# Patient Record
Sex: Female | Born: 1975 | Hispanic: Yes | Marital: Married | Smoking: Never smoker
Health system: Southern US, Community
[De-identification: ages and names within clinical notes are randomized; demographics above are authoritative.]

## PROBLEM LIST (undated history)

## (undated) ENCOUNTER — Ambulatory Visit (HOSPITAL_COMMUNITY): Admission: EM | Source: Home / Self Care

## (undated) DIAGNOSIS — E119 Type 2 diabetes mellitus without complications: Secondary | ICD-10-CM

## (undated) HISTORY — PX: TUBAL LIGATION: SHX77

## (undated) HISTORY — DX: Type 2 diabetes mellitus without complications: E11.9

## (undated) HISTORY — PX: FOOT SURGERY: SHX648

---

## 2008-11-09 DIAGNOSIS — E119 Type 2 diabetes mellitus without complications: Secondary | ICD-10-CM | POA: Insufficient documentation

## 2017-07-25 ENCOUNTER — Emergency Department (HOSPITAL_COMMUNITY)
Admission: EM | Admit: 2017-07-25 | Discharge: 2017-07-25 | Disposition: A | Discharge status: Home / Self Care | Payer: 59 | Attending: Emergency Medicine | Admitting: Emergency Medicine

## 2017-07-25 ENCOUNTER — Encounter (HOSPITAL_COMMUNITY): Payer: Self-pay | Admitting: Emergency Medicine

## 2017-07-25 DIAGNOSIS — G43901 Migraine, unspecified, not intractable, with status migrainosus: Secondary | ICD-10-CM | POA: Diagnosis not present

## 2017-07-25 DIAGNOSIS — Z7984 Long term (current) use of oral hypoglycemic drugs: Secondary | ICD-10-CM | POA: Insufficient documentation

## 2017-07-25 DIAGNOSIS — G43001 Migraine without aura, not intractable, with status migrainosus: Secondary | ICD-10-CM | POA: Diagnosis not present

## 2017-07-25 DIAGNOSIS — R51 Headache: Secondary | ICD-10-CM | POA: Diagnosis present

## 2017-07-25 LAB — POC URINE PREG, ED: Preg Test, Ur: NEGATIVE

## 2017-07-25 MED ORDER — PROCHLORPERAZINE EDISYLATE 5 MG/ML IJ SOLN
10.0000 mg | Freq: Once | INTRAMUSCULAR | Status: AC
  Administered 2017-07-25: 10 mg via INTRAVENOUS
  Filled 2017-07-25: qty 2

## 2017-07-25 MED ORDER — DIPHENHYDRAMINE HCL 50 MG/ML IJ SOLN
25.0000 mg | Freq: Once | INTRAMUSCULAR | Status: AC
  Administered 2017-07-25: 25 mg via INTRAVENOUS
  Filled 2017-07-25: qty 1

## 2017-07-25 MED ORDER — SODIUM CHLORIDE 0.9 % IV BOLUS (SEPSIS)
1000.0000 mL | Freq: Once | INTRAVENOUS | Status: AC
  Administered 2017-07-25: 1000 mL via INTRAVENOUS

## 2017-07-25 MED ORDER — ONDANSETRON 4 MG PO TBDP
4.0000 mg | ORAL_TABLET | Freq: Once | ORAL | Status: AC
  Administered 2017-07-25: 4 mg via ORAL

## 2017-07-25 MED ORDER — ONDANSETRON 4 MG PO TBDP
ORAL_TABLET | ORAL | Status: AC
  Filled 2017-07-25: qty 1

## 2017-07-25 MED ORDER — KETOROLAC TROMETHAMINE 30 MG/ML IJ SOLN
30.0000 mg | Freq: Once | INTRAMUSCULAR | Status: AC
  Administered 2017-07-25: 30 mg via INTRAVENOUS
  Filled 2017-07-25: qty 1

## 2017-07-25 MED ORDER — MAGNESIUM SULFATE 2 GM/50ML IV SOLN
2.0000 g | Freq: Once | INTRAVENOUS | Status: AC
  Administered 2017-07-25: 2 g via INTRAVENOUS
  Filled 2017-07-25: qty 50

## 2017-07-25 NOTE — ED Provider Notes (Signed)
[facility] DEPT Provider Note   CSN: [id number] Arrival date & time: 07/25/17  1120     History   Chief Complaint Chief Complaint  Patient presents with  . Migraine  . Emesis    HPI Priscilla Perry is a 41 y.o. female.  44-year-old female with past medical history including migraines who presents with headache and vomiting. Yesterday the patient woke up from a nap and had a throbbing headache that she states is exactly like previous migraines. She tried taking ibuprofen without much relief. This morning she woke up and felt like it was slightly better so she took an oral dissolving pill that is a migraine medication. Despite this medication her symptoms have worsened. She has had nausea and vomiting which she sometimes gets with her migraines. She reports photophobia and phonophobia. No extremity numbness or weakness. No problems walking. She denies any fevers or recent illness. No recent head injury. Normal vision.   The history is provided by the patient.  Migraine   Emesis      History reviewed. No pertinent past medical history.  There are no active problems to display for this patient.   History reviewed. No pertinent surgical history.  OB History    No data available       Home Medications    Prior to Admission medications   Medication Sig Start Date End Date Taking? Authorizing Provider  ibuprofen (ADVIL,MOTRIN) 800 MG tablet Take 800 mg by mouth every 8 (eight) hours as needed for headache or moderate pain.   Yes [provider]  metFORMIN (GLUCOPHAGE) 1000 MG tablet Take 1,000 mg by mouth 2 (two) times daily with a meal.   Yes [provider]    Family History No family history on file.  Social History Social History  Substance Use Topics  . Smoking status: Never Smoker  . Smokeless tobacco: Never Used  . Alcohol use No     Allergies   Patient has no known allergies.   Review of Systems Review of Systems    Gastrointestinal: Positive for vomiting.   All other systems reviewed and are negative except that which was mentioned in HPI   Physical Exam Updated Vital Signs BP 125/89 (BP Location: Left Arm)   Pulse (!) 115   Temp 98.2 F (36.8 C) (Oral)   Resp 18   Ht  (1.702 m)   Wt 99.8 kg (220 lb)   LMP 07/21/2017   SpO2 100%   BMI 34.46 kg/m   Physical Exam  Constitutional: She is oriented to person, place, and time. She appears well-developed and well-nourished. No distress.  Uncomfortable, in dark room, Awake, alert  HENT:  Head: Normocephalic and atraumatic.  Eyes: Pupils are equal, round, and reactive to light. Conjunctivae and EOM are normal.  Neck: Neck supple.  Cardiovascular: Normal rate, regular rhythm and normal heart sounds.   No murmur heard. Pulmonary/Chest: Effort normal and breath sounds normal. No respiratory distress.  Abdominal: Soft. Bowel sounds are normal. She exhibits no distension. There is no tenderness.  Musculoskeletal: She exhibits no edema.  Neurological: She is alert and oriented to person, place, and time. She has normal reflexes. No cranial nerve deficit. She exhibits normal muscle tone.  Fluent speech, normal finger-to-nose testing, negative pronator drift, no clonus 5/5 strength and normal sensation x all 4 extremities  Skin: Skin is warm and dry.  Psychiatric: She has a normal mood and affect. Judgment and thought content normal.  Nursing note and vitals reviewed.  ED Treatments / Results  Labs (all labs ordered are listed, but only abnormal results are displayed) Labs Reviewed  POC URINE PREG, ED    EKG  EKG Interpretation None       Radiology No results found.  Procedures Procedures (including critical care time)  Medications Ordered in ED Medications  ondansetron (ZOFRAN-ODT) 4 MG disintegrating tablet (not administered)  sodium chloride 0.9 % bolus 1,000 mL (not administered)  magnesium sulfate IVPB 2 g 50 mL (2 g  Intravenous New Bag/Given 07/25/17 1507)  ondansetron (ZOFRAN-ODT) disintegrating tablet 4 mg (4 mg Oral Given 07/25/17 1145)  diphenhydrAMINE (BENADRYL) injection 25 mg (25 mg Intravenous Given 07/25/17 1502)  prochlorperazine (COMPAZINE) injection 10 mg (10 mg Intravenous Given 07/25/17 1506)  ketorolac (TORADOL) 30 MG/ML injection 30 mg (30 mg Intravenous Given 07/25/17 1501)     Initial Impression / Assessment and Plan / ED Course  I have reviewed the triage vital signs and the nursing notes.  Pertinent labs  that were available during my care of the patient were reviewed by me and considered in my medical decision making (see chart for details).     PT w/ headache and N/V similar to previous episodes of migraine. Reassuring VS and normal neurologic exam. Given hx similar to previous headaches and no concerning sx or findings on exam, I do not feel she needs head imaging. Gave migraine cocktail.   On reexamination, pt stated her headache was much improved.  Discussed return precautions including severe worsening symptoms, fever, neck stiffness, or any new neurologic symptoms. Patient voiced understanding and was discharged in satisfactory condition.  Final Clinical Impressions(s) / ED Diagnoses   Final diagnoses:  Migraine with status migrainosus, not intractable, unspecified migraine type    New Prescriptions New Prescriptions   No medications on file     Little, Ambrose Finland, MD 07/25/17 1540

## 2017-07-25 NOTE — ED Triage Notes (Signed)
Pt. Stated, I started having a headache and then nausea vomiting and I've taken everything and it keeps coming back up.

## 2017-08-17 DIAGNOSIS — W274XXA Contact with kitchen utensil, initial encounter: Secondary | ICD-10-CM | POA: Diagnosis not present

## 2017-08-17 DIAGNOSIS — S61011A Laceration without foreign body of right thumb without damage to nail, initial encounter: Secondary | ICD-10-CM | POA: Diagnosis not present

## 2017-12-22 DIAGNOSIS — N920 Excessive and frequent menstruation with regular cycle: Secondary | ICD-10-CM | POA: Insufficient documentation

## 2018-07-18 DIAGNOSIS — H608X3 Other otitis externa, bilateral: Secondary | ICD-10-CM | POA: Insufficient documentation

## 2018-07-18 DIAGNOSIS — H60549 Acute eczematoid otitis externa, unspecified ear: Secondary | ICD-10-CM | POA: Insufficient documentation

## 2020-01-22 ENCOUNTER — Other Ambulatory Visit: Payer: Self-pay | Admitting: Endocrinology

## 2020-01-23 ENCOUNTER — Other Ambulatory Visit: Payer: Self-pay | Admitting: Endocrinology

## 2020-01-23 DIAGNOSIS — R1013 Epigastric pain: Secondary | ICD-10-CM

## 2021-11-09 DIAGNOSIS — J189 Pneumonia, unspecified organism: Secondary | ICD-10-CM

## 2021-11-09 HISTORY — DX: Pneumonia, unspecified organism: J18.9

## 2022-02-12 ENCOUNTER — Ambulatory Visit (INDEPENDENT_AMBULATORY_CARE_PROVIDER_SITE_OTHER): Payer: 59 | Admitting: Pulmonary Disease

## 2022-02-12 ENCOUNTER — Encounter: Payer: Self-pay | Admitting: Pulmonary Disease

## 2022-02-12 VITALS — BP 120/74 | HR 97 | Temp 98.1°F | Ht 67.0 in | Wt 196.8 lb

## 2022-02-12 DIAGNOSIS — R9389 Abnormal findings on diagnostic imaging of other specified body structures: Secondary | ICD-10-CM

## 2022-02-12 DIAGNOSIS — J984 Other disorders of lung: Secondary | ICD-10-CM | POA: Diagnosis not present

## 2022-02-12 NOTE — Patient Instructions (Addendum)
Thank you for visiting Dr. Tonia Brooms at [facility]. ?Today we recommend the following: ? ?Orders Placed This Encounter  ?Procedures  ? CT CHEST HIGH RESOLUTION  ? ?See [place] after your CT Chest.  ? ?Return in about 2 weeks (around 02/26/2022) for with APP. To review CT Chest.  ? ? ? ?Please do your part to reduce the spread of COVID-19.  ? ?

## 2022-02-12 NOTE — Progress Notes (Signed)
? ?Synopsis: Referred in April 2023 for abnormal cxr by Adrian Prince, MD ? ?Subjective:  ? ?PATIENT ID: Priscilla Perry GENDER: female DOB: [birth year], MRN: [id number] ? ?Chief Complaint  ?Patient presents with  ? Consult  ?  Consult.   ? ? ?This is a 46 year old female, past medical history of diabetes, BMI of 30.  She presents today with an abnormal chest x-ray.  She was seen by primary care after having some pain along the left chest that is underneath the left breast.  She had a chest x-ray that showed an abnormality within the left lower lobe and she was treated with antibiotics.  She had a follow-up evaluation by primary care with Dr. Evlyn Kanner and had a repeat chest x-ray that showed the abnormality was still present.  Felt to be some scarring in the base of the lungs.  She has no significant family history for any rheumatologic diseases.  She has no significant family history of lung diseases.  She does have significant secondhand smoke exposure.  Both of her parents were smokers.  From respiratory standpoint she is able to do all of her activities of daily living and she has no  short notes of breath.  She does occasionally have cough. ? ? ?Past Medical History:  ?Diagnosis Date  ? Diabetes (HCC)   ?  ? ?Family History  ?Problem Relation Age of Onset  ? Gastric cancer Mother   ? Diabetes Mother   ? Diabetes Father   ?  ? ?Past Surgical History:  ?Procedure Laterality Date  ? CESAREAN SECTION    ? FOOT SURGERY    ? TUBAL LIGATION    ? ? ?Social History  ? ?Socioeconomic History  ? Marital status: Unknown  ?  Spouse name: Not on file  ? Number of children: Not on file  ? Years of education: Not on file  ? Highest education level: Not on file  ?Occupational History  ? Not on file  ?Tobacco Use  ? Smoking status: Never  ? Smokeless tobacco: Never  ?Substance and Sexual Activity  ? Alcohol use: No  ? Drug use: No  ? Sexual activity: Not on file  ?Other Topics Concern  ? Not on file  ?Social History Narrative  ? Not  on file  ? ?Social Determinants of Health  ? ?Financial Resource Strain: Not on file  ?Food Insecurity: Not on file  ?Transportation Needs: Not on file  ?Physical Activity: Not on file  ?Stress: Not on file  ?Social Connections: Not on file  ?Intimate Partner Violence: Not on file  ?  ? ?No Known Allergies  ? ?Outpatient Medications Prior to Visit  ?Medication Sig Dispense Refill  ? tirzepatide (MOUNJARO) 7.5 MG/0.5ML Pen INJECT 7.5 MG UNDER THE SKIN ONCE WEEKLY    ? ibuprofen (ADVIL,MOTRIN) 800 MG tablet Take 800 mg by mouth every 8 (eight) hours as needed for headache or moderate pain. (Patient not taking: Reported on 02/12/2022)    ? metFORMIN (GLUCOPHAGE) 1000 MG tablet Take 1,000 mg by mouth 2 (two) times daily with a meal. (Patient not taking: Reported on 02/12/2022)    ? ?No facility-administered medications prior to visit.  ? ? ?Review of Systems  ?Constitutional:  Negative for chills, fever, malaise/fatigue and weight loss.  ?HENT:  Negative for hearing loss, sore throat and tinnitus.   ?Eyes:  Negative for blurred vision and double vision.  ?Respiratory:  Positive for cough. Negative for hemoptysis, sputum production, shortness of breath, wheezing and stridor.   ?  Cardiovascular:  Negative for chest pain, palpitations, orthopnea, leg swelling and PND.  ?Gastrointestinal:  Negative for abdominal pain, constipation, diarrhea, heartburn, nausea and vomiting.  ?Genitourinary:  Negative for dysuria, hematuria and urgency.  ?Musculoskeletal:  Negative for joint pain and myalgias.  ?Skin:  Negative for itching and rash.  ?Neurological:  Negative for dizziness, tingling, weakness and headaches.  ?Endo/Heme/Allergies:  Negative for environmental allergies. Does not bruise/bleed easily.  ?Psychiatric/Behavioral:  Negative for depression. The patient is not nervous/anxious and does not have insomnia.   ?All other systems reviewed and are negative. ? ? ?Objective:  ?Physical Exam ? ? ?Vitals:  ? 02/12/22 1359  ?BP: 120/74   ?Pulse: 97  ?Temp: 98.1 ?F (36.7 ?C)  ?TempSrc: Oral  ?SpO2: 100%  ?Weight: 196 lb 12.8 oz (89.3 kg)  ?Height: 5\' 7"  (1.702 m)  ? ?100% on RA ?BMI Readings from Last 3 Encounters:  ?02/12/22 30.82 kg/m?  ?07/25/17 34.46 kg/m?  ? ?Wt Readings from Last 3 Encounters:  ?02/12/22 196 lb 12.8 oz (89.3 kg)  ?07/25/17 220 lb (99.8 kg)  ? ? ? ?CBC ?No results found for: WBC, RBC, HGB, HCT, PLT, MCV, MCH, MCHC, RDW, LYMPHSABS, MONOABS, EOSABS, BASOSABS ? ? ?Chest Imaging: ? ?Chest x-ray report with some areas of basilar scarring. ?Unable to review chest x-ray imaging myself ? ?Pulmonary Functions Testing Results: ?   ? View : No data to display.  ?  ?  ?  ? ? ?FeNO:  ? ?Pathology:  ? ?Echocardiogram:  ? ?Heart Catheterization:  ?   ?Assessment & Plan:  ? ?  ICD-10-CM   ?1. Scarring of lung  J98.4 CT CHEST HIGH RESOLUTION  ?  ?2. Abnormal CXR  R93.89 CT CHEST HIGH RESOLUTION  ?  ? ? ?Discussion: ?This is a 46 year old female with scarring of the lung possibly in the left as seen on chest x-ray with an abnormal chest x-ray.  She has not had any axial CT imaging of the chest.  Upon cursory review and discussion with the patient she has no obvious risk factors for the development of any type of interstitial lung disease however with the persistent of abnormality will recommend having a diagnostic CT chest ? ?Plan: ?HRCT scan of the chest to see if there is any evidence of abnormality or scarring. ?Patient to follow-up with APP in 2 weeks to review CT results. ? ? ?Current Outpatient Medications:  ?  tirzepatide (MOUNJARO) 7.5 MG/0.5ML Pen, INJECT 7.5 MG UNDER THE SKIN ONCE WEEKLY, Disp: , Rfl:  ?  ibuprofen (ADVIL,MOTRIN) 800 MG tablet, Take 800 mg by mouth every 8 (eight) hours as needed for headache or moderate pain. (Patient not taking: Reported on 02/12/2022), Disp: , Rfl:  ?  metFORMIN (GLUCOPHAGE) 1000 MG tablet, Take 1,000 mg by mouth 2 (two) times daily with a meal. (Patient not taking: Reported on 02/12/2022), Disp: , Rfl:   ? ? ?04/14/2022, DO ?[facility] ?02/12/2022 2:23 PM   ? ?

## 2022-02-26 ENCOUNTER — Ambulatory Visit
Admission: RE | Admit: 2022-02-26 | Discharge: 2022-02-26 | Disposition: A | Discharge status: Home / Self Care | Payer: 59 | Source: Ambulatory Visit | Attending: Pulmonary Disease | Admitting: Pulmonary Disease

## 2022-02-26 DIAGNOSIS — J984 Other disorders of lung: Secondary | ICD-10-CM

## 2022-02-26 DIAGNOSIS — R9389 Abnormal findings on diagnostic imaging of other specified body structures: Secondary | ICD-10-CM

## 2022-03-04 ENCOUNTER — Encounter: Payer: Self-pay | Admitting: Nurse Practitioner

## 2022-03-04 ENCOUNTER — Ambulatory Visit (INDEPENDENT_AMBULATORY_CARE_PROVIDER_SITE_OTHER): Payer: 59 | Admitting: Nurse Practitioner

## 2022-03-04 DIAGNOSIS — R9389 Abnormal findings on diagnostic imaging of other specified body structures: Secondary | ICD-10-CM | POA: Diagnosis not present

## 2022-03-04 NOTE — Assessment & Plan Note (Signed)
Previously treated for possible pneumonia by PCP.  Repeat CXR for evaluation of resolution showed persistent abnormality in the left lower lobe.  She has no respiratory concerns or complaints.  HRCT was obtained for further evaluation without any underlying pulmonary etiology noted.  There is no evidence of ILD.  Discussed that finding on previous CXR could have been a shadow or possibly some atelectasis from previous infection.   ? ?Patient Instructions  ?Your high resolution CT scan looked good and did not show any lung abnormalities. I have sent the report to your PCP for review. ? ?Please follow up with Dr. Tonia Brooms as needed if you develop any respiratory concerns. Thanks!  ? ?

## 2022-03-04 NOTE — Progress Notes (Signed)
? ?@Patient  ID: Priscilla Perry, female    DOB: Nov 05, 1976, 46 y.o.   MRN: [id number] ? ?Chief Complaint  ?Patient presents with  ? Follow-up  ?  She is her to go over her CT scan,.   ? ? ?Referring provider: ?No ref. provider found ? ?HPI: ?46 year old female, never smoker followed for abnormal chest x-ray with concern for possible scarring of the lung.  She is a patient of Dr. Juline Patch and last seen in office on 02/12/2022.  Past medical history of diabetes. ? ?TEST/EVENTS:  ?02/26/2022 HRCT chest: There were no acute findings present.  No evidence of interstitial lung disease.  Overall unremarkable exam. ? ?02/12/2022: OV with Dr. Valeta Harms.  Seen by primary care after having some pain along the left chest that is underneath the left breast.  She had a chest x-ray that showed an abnormality within the left lower lobe and was treated with antibiotics for possible pneumonia.  She then had a follow-up evaluation by primary care with Dr. Forde Dandy and repeat chest x-ray that showed the abnormality was still present.  Felt to be some scarring in the base of the lungs.  No respiratory concerns.  Very minimal occasional cough.  No obvious risk factors for development of ILD; however given persistent change on x-ray, HRCT ordered. ? ?03/04/2022: Today-follow-up ?Patient presents today to discuss high-resolution CT scan results.  There was no evidence of ILD or abnormality of the lungs noted.  Patient reports that she continues to feel well.  Has no respiratory complaints or concerns.  Says that she rarely has a cough which is dry, nothing that ever really concerns her.  Denies any wheezing, orthopedic, PND, lower extremity edema, hemoptysis, weight loss. ? ?No Known Allergies ? ?Immunization History  ?Administered Date(s) Administered  ? Moderna Sars-Covid-2 Vaccination 12/10/2020, 01/07/2021  ? ? ?Past Medical History:  ?Diagnosis Date  ? Diabetes ([place])   ? ? ?Tobacco History: ?Social History  ? ?Tobacco Use  ?Smoking Status Never   ?Smokeless Tobacco Never  ? ?Counseling given: Not Answered ? ? ?Outpatient Medications Prior to Visit  ?Medication Sig Dispense Refill  ? rizatriptan (MAXALT) 10 MG tablet Take 10 mg by mouth as needed for migraine. May repeat in 2 hours if needed    ? tirzepatide (MOUNJARO) 7.5 MG/0.5ML Pen INJECT 7.5 MG UNDER THE SKIN ONCE WEEKLY    ? ibuprofen (ADVIL,MOTRIN) 800 MG tablet Take 800 mg by mouth every 8 (eight) hours as needed for headache or moderate pain. (Patient not taking: Reported on 02/12/2022)    ? metFORMIN (GLUCOPHAGE) 1000 MG tablet Take 1,000 mg by mouth 2 (two) times daily with a meal. (Patient not taking: Reported on 02/12/2022)    ? ?No facility-administered medications prior to visit.  ? ? ? ?Review of Systems:  ? ?Constitutional: No weight loss or gain, night sweats, fevers, chills, fatigue, or lassitude. ?HEENT: No headaches, difficulty swallowing, tooth/dental problems, or sore throat. No sneezing, itching, ear ache, nasal congestion, or post nasal drip ?CV:  No chest pain, orthopnea, PND, swelling in lower extremities, anasarca, dizziness, palpitations, syncope ?Resp: + Very rare, dry cough.  No shortness of breath with exertion or at rest. No excess mucus or change in color of mucus. No productive or non-productive. No hemoptysis. No wheezing.  No chest wall deformity ?GI:  No heartburn, indigestion, abdominal pain, nausea, vomiting, diarrhea, change in bowel habits, loss of appetite, bloody stools.  ?Skin: No rash, lesions, ulcerations ?MSK:  No joint pain or swelling.  No  decreased range of motion.  No back pain. ?Neuro: No dizziness or lightheadedness.  ?Psych: No depression or anxiety. Mood stable.  ? ? ? ?Physical Exam: ? ?BP 112/74 (BP Location: Left Arm, Patient Position: Sitting, Cuff Size: Normal)   Pulse 100   Temp 98.4 ?F (36.9 ?C) (Oral)   Ht 5\' 7"  (1.702 m)   Wt 192 lb 12.8 oz (87.5 kg)   LMP 02/18/2022   SpO2 100%   BMI 30.20 kg/m?  ? ?GEN: Pleasant, interactive,  well-appearing; obese; in no acute distress. ?HEENT:  Normocephalic and atraumatic. PERRLA. Sclera white. Nasal turbinates pink, moist and patent bilaterally. No rhinorrhea present. Oropharynx pink and moist, without exudate or edema. No lesions, ulcerations, or postnasal drip.  ?NECK:  Supple w/ fair ROM. No JVD present. Normal carotid impulses w/o bruits. Thyroid symmetrical with no goiter or nodules palpated. No lymphadenopathy.   ?CV: RRR, no m/r/g, no peripheral edema. Pulses intact, +2 bilaterally. No cyanosis, pallor or clubbing. ?PULMONARY:  Unlabored, regular breathing. Clear bilaterally A&P w/o wheezes/rales/rhonchi. No accessory muscle use. No dullness to percussion. ?GI: BS present and normoactive. Soft, non-tender to palpation.  ?MSK: No erythema, warmth or tenderness. Cap refil <2 sec all extrem. No deformities or joint swelling noted.  ?Neuro: A/Ox3. No focal deficits noted.   ?Skin: Warm, no lesions or rashe ?Psych: Normal affect and behavior. Judgement and thought content appropriate.  ? ? ? ?Lab Results: ? ?CBC ?No results found for: WBC, RBC, HGB, HCT, PLT, MCV, MCH, MCHC, RDW, LYMPHSABS, MONOABS, EOSABS, BASOSABS ? ?BMET ?No results found for: NA, K, CL, CO2, GLUCOSE, BUN, CREATININE, CALCIUM, GFRNONAA, GFRAA ? ?BNP ?No results found for: BNP ? ? ?Imaging: ? ?CT CHEST HIGH RESOLUTION ? ?Result Date: 02/27/2022 ?CLINICAL DATA:  46 year old female with history of abnormal chest x-ray. Evaluate for interstitial lung disease. EXAM: CT CHEST WITHOUT CONTRAST TECHNIQUE: Multidetector CT imaging of the chest was performed following the standard protocol without intravenous contrast. High resolution imaging of the lungs, as well as inspiratory and expiratory imaging, was performed. RADIATION DOSE REDUCTION: This exam was performed according to the departmental dose-optimization program which includes automated exposure control, adjustment of the mA and/or kV according to patient size and/or use of  iterative reconstruction technique. COMPARISON:  No priors. FINDINGS: Cardiovascular: Heart size is normal. There is no significant pericardial fluid, thickening or pericardial calcification. No atherosclerotic calcifications are noted in the thoracic aorta or the coronary arteries. Mediastinum/Nodes: No pathologically enlarged mediastinal or hilar lymph nodes. Please note that accurate exclusion of hilar adenopathy is limited on noncontrast CT scans. Esophagus is unremarkable in appearance. No axillary lymphadenopathy. Lungs/Pleura: High-resolution images demonstrate no significant regions of ground-glass attenuation, septal thickening, subpleural reticulation, parenchymal banding, traction bronchiectasis or frank honeycombing to indicate interstitial lung disease. Inspiratory and expiratory imaging is unremarkable. No acute consolidative airspace disease. No pleural effusions. No suspicious appearing pulmonary nodules or masses are noted. Upper Abdomen: Unremarkable. Musculoskeletal: There are no aggressive appearing lytic or blastic lesions noted in the visualized portions of the skeleton. IMPRESSION: 1. No findings to suggest interstitial lung disease. 2. No acute findings in the thorax. Electronically Signed   By: Vinnie Langton M.D.   On: 02/27/2022 09:51   ? ? ? ?   ? View : No data to display.  ?  ?  ?  ? ? ?No results found for: NITRICOXIDE ? ? ? ? ? ?Assessment & Plan:  ? ?Abnormal chest x-ray ?Previously treated for possible pneumonia by PCP.  Repeat CXR for evaluation of resolution showed persistent abnormality in the left lower lobe.  She has no respiratory concerns or complaints.  HRCT was obtained for further evaluation without any underlying pulmonary etiology noted.  There is no evidence of ILD.  Discussed that finding on previous CXR could have been a shadow or possibly some atelectasis from previous infection.   ? ?Patient Instructions  ?Your high resolution CT scan looked good and did not show  any lung abnormalities. I have sent the report to your PCP for review. ? ?Please follow up with Dr. Valeta Harms as needed if you develop any respiratory concerns. Thanks!  ? ? ?I spent 22 minutes of dedicated to the care of this patient on

## 2022-03-04 NOTE — Patient Instructions (Signed)
Your high resolution CT scan looked good and did not show any lung abnormalities. I have sent the report to your PCP for review. ? ?Please follow up with Dr. Valeta Harms as needed if you develop any respiratory concerns. Thanks!  ?

## 2022-04-07 ENCOUNTER — Other Ambulatory Visit: Payer: Self-pay | Admitting: Otolaryngology

## 2022-05-05 ENCOUNTER — Encounter (HOSPITAL_COMMUNITY): Payer: Self-pay | Admitting: Otolaryngology

## 2022-05-05 ENCOUNTER — Other Ambulatory Visit: Payer: Self-pay

## 2022-05-05 NOTE — Pre-Procedure Instructions (Signed)
[facility] [id number] [place], [place] - [address] [facility] [address] [place] [address] Phone: [phone] Fax: [phone]   PCP - Sharol Given, MD  EKG - DOS  Fasting Blood Sugar - 90-110 Checks Blood Sugar: Monitor checks the sugar every 5 minutes  ERAS Protcol - Clears until 0900  COVID TEST- N  Anesthesia review: N  Patient verbally denies any shortness of breath, fever, cough and chest pain during phone call   -------------  SDW INSTRUCTIONS given:  Your procedure is scheduled on 05/06/22.  Report to [facility] Main Entrance "A" at 0930 A.M., and check in at the Admitting office.  Call this number if you have problems the morning of surgery:  [phone]   Remember:  Do not eat after midnight the night before your surgery  You may drink clear liquids until 0900 the morning of your surgery.   Clear liquids allowed are: Water, Non-Citrus Juices (without pulp), Carbonated Beverages, Clear Tea, Black Coffee Only, and Gatorade    Take these medicines the morning of surgery with A SIP OF WATER  N/A  DO NOT take Synjardy 6/27 & 6/28  .** PLEASE check your blood sugar the morning of your surgery when you wake up and every 2 hours until you get to the Short Stay unit.  If your blood sugar is less than 70 mg/dL, you will need to treat for low blood sugar: Do not take insulin. Treat a low blood sugar (less than 70 mg/dL) with  cup of clear juice (cranberry or apple), 4 glucose tablets, OR glucose gel. Recheck blood sugar in 15 minutes after treatment (to make sure it is greater than 70 mg/dL). If your blood sugar is not greater than 70 mg/dL on recheck, call [phone] for further instructions.   As of today, STOP taking any Aspirin (unless otherwise instructed by your surgeon) Aleve, Naproxen, Ibuprofen, Motrin, Advil, Goody's, BC's, all herbal medications, fish oil, and all vitamins.                      Do not wear jewelry, make up,  or nail polish            Do not wear lotions, powders, perfumes/colognes, or deodorant.            Do not shave 48 hours prior to surgery.  Men may shave face and neck.            Do not bring valuables to the hospital.            [facility] is not responsible for any belongings or valuables.  Do NOT Smoke (Tobacco/Vaping) 24 hours prior to your procedure If you use a CPAP at night, you may bring all equipment for your overnight stay.   Contacts, glasses, dentures or bridgework may not be worn into surgery.      For patients admitted to the hospital, discharge time will be determined by your treatment team.   Patients discharged the day of surgery will not be allowed to drive home, and someone needs to stay with them for 24 hours.    Special instructions:   [place]- Preparing For Surgery  Before surgery, you can play an important role. Because skin is not sterile, your skin needs to be as free of germs as possible. You can reduce the number of germs on your skin by washing with CHG (chlorahexidine gluconate) Soap before surgery.  CHG is an antiseptic cleaner  which kills germs and bonds with the skin to continue killing germs even after washing.    Oral Hygiene is also important to reduce your risk of infection.  Remember - BRUSH YOUR TEETH THE MORNING OF SURGERY WITH YOUR REGULAR TOOTHPASTE  Please do not use if you have an allergy to CHG or antibacterial soaps. If your skin becomes reddened/irritated stop using the CHG.  Do not shave (including legs and underarms) for at least 48 hours prior to first CHG shower. It is OK to shave your face.  Please follow these instructions carefully.   Shower the NIGHT BEFORE SURGERY and the MORNING OF SURGERY with DIAL Soap.   Pat yourself dry with a CLEAN TOWEL.  Wear CLEAN PAJAMAS to bed the night before surgery  Place CLEAN SHEETS on your bed the night of your first shower and DO NOT SLEEP WITH PETS.   Day of Surgery: Please shower  morning of surgery  Wear Clean/Comfortable clothing the morning of surgery Do not apply any deodorants/lotions.   Remember to brush your teeth WITH YOUR REGULAR TOOTHPASTE.   Questions were answered. Patient verbalized understanding of instructions.

## 2022-05-06 ENCOUNTER — Ambulatory Visit (HOSPITAL_COMMUNITY)
Admission: RE | Admit: 2022-05-06 | Discharge: 2022-05-06 | Disposition: A | Discharge status: Home / Self Care | Payer: 59 | Source: Ambulatory Visit | Attending: Otolaryngology | Admitting: Otolaryngology

## 2022-05-06 ENCOUNTER — Ambulatory Visit (HOSPITAL_BASED_OUTPATIENT_CLINIC_OR_DEPARTMENT_OTHER): Payer: 59 | Admitting: Anesthesiology

## 2022-05-06 ENCOUNTER — Encounter (HOSPITAL_COMMUNITY)
Admission: RE | Disposition: A | Discharge status: Home / Self Care | Payer: Self-pay | Source: Ambulatory Visit | Attending: Otolaryngology

## 2022-05-06 ENCOUNTER — Encounter (HOSPITAL_COMMUNITY): Payer: Self-pay | Admitting: Otolaryngology

## 2022-05-06 ENCOUNTER — Ambulatory Visit (HOSPITAL_COMMUNITY): Payer: 59 | Admitting: Anesthesiology

## 2022-05-06 DIAGNOSIS — Z7984 Long term (current) use of oral hypoglycemic drugs: Secondary | ICD-10-CM | POA: Insufficient documentation

## 2022-05-06 DIAGNOSIS — J358 Other chronic diseases of tonsils and adenoids: Secondary | ICD-10-CM | POA: Insufficient documentation

## 2022-05-06 DIAGNOSIS — J3501 Chronic tonsillitis: Secondary | ICD-10-CM

## 2022-05-06 DIAGNOSIS — E119 Type 2 diabetes mellitus without complications: Secondary | ICD-10-CM | POA: Insufficient documentation

## 2022-05-06 HISTORY — PX: TONSILLECTOMY: SHX5217

## 2022-05-06 LAB — BASIC METABOLIC PANEL
Anion gap: 9 (ref 5–15)
BUN: 13 mg/dL (ref 6–20)
CO2: 21 mmol/L — ABNORMAL LOW (ref 22–32)
Calcium: 8.9 mg/dL (ref 8.9–10.3)
Chloride: 107 mmol/L (ref 98–111)
Creatinine, Ser: 0.63 mg/dL (ref 0.44–1.00)
GFR, Estimated: >60 mL/min (ref >60)
Glucose, Bld: 105 mg/dL — ABNORMAL HIGH (ref 70–99)
Potassium: 3.5 mmol/L (ref 3.5–5.1)
Sodium: 137 mmol/L (ref 135–145)

## 2022-05-06 LAB — GLUCOSE, CAPILLARY
Glucose-Capillary: 114 mg/dL — ABNORMAL HIGH (ref 70–99)
Glucose-Capillary: 79 mg/dL (ref 70–99)
Glucose-Capillary: 101 mg/dL — ABNORMAL HIGH (ref 70–99)

## 2022-05-06 LAB — CBC
HCT: 37.5 % (ref 36.0–46.0)
Hemoglobin: 12.5 g/dL (ref 12.0–15.0)
MCH: 30.5 pg (ref 26.0–34.0)
MCHC: 33.3 g/dL (ref 30.0–36.0)
MCV: 91.5 fL (ref 80.0–100.0)
Platelets: 189 10*3/uL (ref 150–400)
RBC: 4.1 MIL/uL (ref 3.87–5.11)
RDW: 13.1 % (ref 11.5–15.5)
WBC: 8.4 10*3/uL (ref 4.0–10.5)
nRBC: 0 % (ref 0.0–0.2)

## 2022-05-06 LAB — POCT PREGNANCY, URINE: Preg Test, Ur: NEGATIVE

## 2022-05-06 SURGERY — TONSILLECTOMY
Anesthesia: General | Laterality: Bilateral

## 2022-05-06 MED ORDER — OXYCODONE HCL 5 MG/5ML PO SOLN: 5.0000 mg | Freq: Once | ORAL | Status: AC | PRN

## 2022-05-06 MED ORDER — HYDROCODONE-ACETAMINOPHEN 7.5-325 MG/15ML PO SOLN: 15.0000 mL | ORAL | 0 refills | Status: AC | PRN

## 2022-05-06 MED ORDER — ORAL CARE MOUTH RINSE: 15.0000 mL | Freq: Once | OROMUCOSAL | Status: AC

## 2022-05-06 MED ORDER — SUGAMMADEX SODIUM 200 MG/2ML IV SOLN
INTRAVENOUS | Status: DC | PRN
  Administered 2022-05-06: 400 mg via INTRAVENOUS

## 2022-05-06 MED ORDER — FENTANYL CITRATE (PF) 100 MCG/2ML IJ SOLN
INTRAMUSCULAR | Status: AC
  Administered 2022-05-06: 25 ug via INTRAVENOUS
  Filled 2022-05-06: qty 2

## 2022-05-06 MED ORDER — INSULIN ASPART 100 UNIT/ML IJ SOLN: 0.0000 [IU] | INTRAMUSCULAR | Status: DC | PRN

## 2022-05-06 MED ORDER — MIDAZOLAM HCL 2 MG/2ML IJ SOLN
INTRAMUSCULAR | Status: DC | PRN
  Administered 2022-05-06: 2 mg via INTRAVENOUS

## 2022-05-06 MED ORDER — CHLORHEXIDINE GLUCONATE 0.12 % MT SOLN
OROMUCOSAL | Status: AC
  Administered 2022-05-06: 15 mL via OROMUCOSAL
  Filled 2022-05-06: qty 15

## 2022-05-06 MED ORDER — CHLORHEXIDINE GLUCONATE 0.12 % MT SOLN: 15.0000 mL | Freq: Once | OROMUCOSAL | Status: AC

## 2022-05-06 MED ORDER — LACTATED RINGERS IV SOLN: INTRAVENOUS | Status: DC

## 2022-05-06 MED ORDER — PROPOFOL 10 MG/ML IV BOLUS
INTRAVENOUS | Status: DC | PRN
  Administered 2022-05-06: 200 mg via INTRAVENOUS

## 2022-05-06 MED ORDER — ACETAMINOPHEN 500 MG PO TABS
ORAL_TABLET | ORAL | Status: AC
  Administered 2022-05-06: 1000 mg via ORAL
  Filled 2022-05-06: qty 2

## 2022-05-06 MED ORDER — SCOPOLAMINE 1 MG/3DAYS TD PT72: 1.0000 | MEDICATED_PATCH | TRANSDERMAL | Status: DC

## 2022-05-06 MED ORDER — SCOPOLAMINE 1 MG/3DAYS TD PT72
MEDICATED_PATCH | TRANSDERMAL | Status: AC
  Administered 2022-05-06: 1.5 mg via TRANSDERMAL
  Filled 2022-05-06: qty 1

## 2022-05-06 MED ORDER — MIDAZOLAM HCL 2 MG/2ML IJ SOLN
INTRAMUSCULAR | Status: AC
  Filled 2022-05-06: qty 2

## 2022-05-06 MED ORDER — FENTANYL CITRATE (PF) 250 MCG/5ML IJ SOLN
INTRAMUSCULAR | Status: DC | PRN
  Administered 2022-05-06: 100 ug via INTRAVENOUS
  Administered 2022-05-06: 150 ug via INTRAVENOUS

## 2022-05-06 MED ORDER — ONDANSETRON HCL 4 MG/2ML IJ SOLN
INTRAMUSCULAR | Status: DC | PRN
  Administered 2022-05-06: 4 mg via INTRAVENOUS

## 2022-05-06 MED ORDER — 0.9 % SODIUM CHLORIDE (POUR BTL) OPTIME
TOPICAL | Status: DC | PRN
  Administered 2022-05-06: 1000 mL

## 2022-05-06 MED ORDER — OXYCODONE HCL 5 MG PO TABS
ORAL_TABLET | ORAL | Status: AC
  Administered 2022-05-06: 5 mg via ORAL
  Filled 2022-05-06: qty 1

## 2022-05-06 MED ORDER — ROCURONIUM BROMIDE 10 MG/ML (PF) SYRINGE
PREFILLED_SYRINGE | INTRAVENOUS | Status: DC | PRN
  Administered 2022-05-06: 50 mg via INTRAVENOUS

## 2022-05-06 MED ORDER — ONDANSETRON HCL 4 MG/2ML IJ SOLN
4.0000 mg | Freq: Once | INTRAMUSCULAR | Status: DC | PRN
Start: 2022-05-06 — End: 2022-05-06

## 2022-05-06 MED ORDER — OXYCODONE HCL 5 MG PO TABS: 5.0000 mg | ORAL_TABLET | Freq: Once | ORAL | Status: AC | PRN

## 2022-05-06 MED ORDER — CEFAZOLIN SODIUM-DEXTROSE 2-4 GM/100ML-% IV SOLN
INTRAVENOUS | Status: AC
  Filled 2022-05-06: qty 100

## 2022-05-06 MED ORDER — ACETAMINOPHEN 500 MG PO TABS: 1000.0000 mg | ORAL_TABLET | Freq: Once | ORAL | Status: AC

## 2022-05-06 MED ORDER — FENTANYL CITRATE (PF) 250 MCG/5ML IJ SOLN
INTRAMUSCULAR | Status: AC
  Filled 2022-05-06: qty 5

## 2022-05-06 MED ORDER — LIDOCAINE 2% (20 MG/ML) 5 ML SYRINGE
INTRAMUSCULAR | Status: DC | PRN
  Administered 2022-05-06: 30 mg via INTRAVENOUS

## 2022-05-06 MED ORDER — CEFAZOLIN SODIUM-DEXTROSE 2-4 GM/100ML-% IV SOLN
2.0000 g | INTRAVENOUS | Status: AC
  Administered 2022-05-06: 2 g via INTRAVENOUS

## 2022-05-06 MED ORDER — DEXAMETHASONE SODIUM PHOSPHATE 10 MG/ML IJ SOLN
INTRAMUSCULAR | Status: DC | PRN
  Administered 2022-05-06: 10 mg via INTRAVENOUS

## 2022-05-06 MED ORDER — FENTANYL CITRATE (PF) 100 MCG/2ML IJ SOLN: 25.0000 ug | INTRAMUSCULAR | Status: DC | PRN

## 2022-05-06 SURGICAL SUPPLY — 33 items
BAG COUNTER SPONGE SURGICOUNT (BAG) ×2 IMPLANT
BLADE SURG 15 STRL LF DISP TIS (BLADE) IMPLANT
BLADE SURG 15 STRL SS (BLADE)
CANISTER SUCT 3000ML PPV (MISCELLANEOUS) ×2 IMPLANT
CATH ROBINSON RED A/P 10FR (CATHETERS) IMPLANT
CLEANER TIP ELECTROSURG 2X2 (MISCELLANEOUS) ×2 IMPLANT
COAGULATOR SUCT SWTCH 10FR 6 (ELECTROSURGICAL) ×2 IMPLANT
DRAPE HALF SHEET 40X57 (DRAPES) IMPLANT
ELECT COATED BLADE 2.86 ST (ELECTRODE) ×2 IMPLANT
ELECT REM PT RETURN 9FT ADLT (ELECTROSURGICAL)
ELECT REM PT RETURN 9FT PED (ELECTROSURGICAL)
ELECTRODE REM PT RETRN 9FT PED (ELECTROSURGICAL) IMPLANT
ELECTRODE REM PT RTRN 9FT ADLT (ELECTROSURGICAL) IMPLANT
GAUZE 4X4 16PLY ~~LOC~~+RFID DBL (SPONGE) ×2 IMPLANT
GLOVE BIO SURGEON STRL SZ7.5 (GLOVE) ×2 IMPLANT
GOWN STRL REUS W/ TWL LRG LVL3 (GOWN DISPOSABLE) ×2 IMPLANT
GOWN STRL REUS W/TWL LRG LVL3 (GOWN DISPOSABLE) ×2
KIT BASIN OR (CUSTOM PROCEDURE TRAY) ×2 IMPLANT
KIT TURNOVER KIT B (KITS) ×2 IMPLANT
NDL HYPO 25GX1X1/2 BEV (NEEDLE) IMPLANT
NEEDLE HYPO 25GX1X1/2 BEV (NEEDLE) IMPLANT
NS IRRIG 1000ML POUR BTL (IV SOLUTION) ×2 IMPLANT
PACK SURGICAL SETUP 50X90 (CUSTOM PROCEDURE TRAY) ×2 IMPLANT
PAD ARMBOARD 7.5X6 YLW CONV (MISCELLANEOUS) ×4 IMPLANT
PENCIL SMOKE EVACUATOR (MISCELLANEOUS) ×2 IMPLANT
POSITIONER HEAD DONUT 9IN (MISCELLANEOUS) ×1 IMPLANT
SPECIMEN JAR SMALL (MISCELLANEOUS) ×4 IMPLANT
SPONGE TONSIL 1.25 RF SGL STRG (GAUZE/BANDAGES/DRESSINGS) ×1 IMPLANT
SYR BULB EAR ULCER 3OZ GRN STR (SYRINGE) ×2 IMPLANT
TUBE CONNECTING 12X1/4 (SUCTIONS) ×2 IMPLANT
TUBE SALEM SUMP 14F W/ARV (TUBING) ×1 IMPLANT
TUBE SALEM SUMP 16 FR W/ARV (TUBING) ×1 IMPLANT
YANKAUER SUCT BULB TIP NO VENT (SUCTIONS) ×2 IMPLANT

## 2022-05-06 NOTE — Progress Notes (Signed)
Dr. Mal Amabile made aware that pt has Dexcom 6 on left abdomen. Per Dr. Mal Amabile, ok for pt to leave device on during surgery.

## 2022-05-06 NOTE — Anesthesia Preprocedure Evaluation (Addendum)
Anesthesia Evaluation  Patient identified by MRN, date of birth, ID band Patient awake    Reviewed: Allergy & Precautions, NPO status , Patient's Chart, lab work & pertinent test results  History of Anesthesia Complications Negative for: history of anesthetic complications  Airway Mallampati: II  TM Distance: >3 FB Neck ROM: Full    Dental  (+) Dental Advisory Given, Teeth Intact   Pulmonary neg pulmonary ROS,    Pulmonary exam normal        Cardiovascular negative cardio ROS Normal cardiovascular exam     Neuro/Psych negative neurological ROS  negative psych ROS   GI/Hepatic negative GI ROS, Neg liver ROS,   Endo/Other  diabetes, Type 2, Oral Hypoglycemic Agents Obesity   Renal/GU negative Renal ROS     Musculoskeletal negative musculoskeletal ROS (+)   Abdominal   Peds  Hematology negative hematology ROS (+)   Anesthesia Other Findings   Reproductive/Obstetrics  s/p tubal ligation                             Anesthesia Physical Anesthesia Plan  ASA: 2  Anesthesia Plan: General   Post-op Pain Management: Tylenol PO (pre-op)*   Induction: Intravenous  PONV Risk Score and Plan: 3 and Treatment may vary due to age or medical condition, Ondansetron, Dexamethasone, Midazolam and Scopolamine patch - Pre-op  Airway Management Planned: Oral ETT  Additional Equipment: None  Intra-op Plan:   Post-operative Plan: Extubation in OR  Informed Consent: I have reviewed the patients History and Physical, chart, labs and discussed the procedure including the risks, benefits and alternatives for the proposed anesthesia with the patient or authorized representative who has indicated his/her understanding and acceptance.     Dental advisory given  Plan Discussed with: CRNA and Anesthesiologist  Anesthesia Plan Comments:        Anesthesia Quick Evaluation

## 2022-05-06 NOTE — H&P (Signed)
Priscilla Perry is an 46 y.o. female.   Chief Complaint: Tonsil stones and chronic tonsillitis HPI: 46 year old female with a long history of recurring tonsil stones and tonsil inflammation.  She presents for surgical management.  Past Medical History:  Diagnosis Date   Diabetes (HCC)    Pneumonia 2023    Past Surgical History:  Procedure Laterality Date   CESAREAN SECTION     FOOT SURGERY     x 3   TUBAL LIGATION      Family History  Problem Relation Age of Onset   Gastric cancer Mother    Diabetes Mother    Diabetes Father    Social History:  reports that she has never smoked. She has never used smokeless tobacco. She reports that she does not drink alcohol and does not use drugs.  Allergies: No Known Allergies  Medications Prior to Admission  Medication Sig Dispense Refill   Cholecalciferol (VITAMIN D3) 50 MCG (2000 UT) capsule Take 4,000 Units by mouth daily.     Cod Liver Oil CAPS Take 1 capsule by mouth daily. 1250 mg     Empagliflozin-metFORMIN HCl ER (SYNJARDY XR) 25-1000 MG TB24 Take 1 tablet by mouth daily.     fluocinonide cream (LIDEX) 0.05 % Apply 1 Application topically 2 (two) times daily.     meloxicam (MOBIC) 15 MG tablet Take 150 mg by mouth daily as needed for muscle pain.     Multiple Vitamin (MULTIVITAMIN) capsule Take 2 capsules by mouth daily. gummies     rizatriptan (MAXALT) 10 MG tablet Take 10 mg by mouth as needed for migraine. May repeat in 2 hours if needed     tirzepatide (MOUNJARO) 7.5 MG/0.5ML Pen INJECT 7.5 MG UNDER THE SKIN ONCE WEEKLY     vitamin B-12 (CYANOCOBALAMIN) 1000 MCG tablet Take 1,000 mcg by mouth daily. gummies      Results for orders placed or performed during the hospital encounter of 05/06/22 (from the past 48 hour(s))  Glucose, capillary     Status: Abnormal   Collection Time: 05/06/22  9:46 AM  Result Value Ref Range   Glucose-Capillary 114 (H) 70 - 99 mg/dL    Comment: Glucose reference range applies only to samples taken  after fasting for at least 8 hours.   Comment 1 Notify RN    Comment 2 Document in Chart   Pregnancy, urine POC     Status: None   Collection Time: 05/06/22 10:09 AM  Result Value Ref Range   Preg Test, Ur NEGATIVE NEGATIVE    Comment:        THE SENSITIVITY OF THIS METHODOLOGY IS >24 mIU/mL   Basic metabolic panel per protocol     Status: Abnormal   Collection Time: 05/06/22 10:23 AM  Result Value Ref Range   Sodium 137 135 - 145 mmol/L   Potassium 3.5 3.5 - 5.1 mmol/L   Chloride 107 98 - 111 mmol/L   CO2 21 (L) 22 - 32 mmol/L   Glucose, Bld 105 (H) 70 - 99 mg/dL    Comment: Glucose reference range applies only to samples taken after fasting for at least 8 hours.   BUN 13 6 - 20 mg/dL   Creatinine, Ser 7.10 0.44 - 1.00 mg/dL   Calcium 8.9 8.9 - 62.6 mg/dL   GFR, Estimated >94 >85 mL/min    Comment: (NOTE) Calculated using the CKD-EPI Creatinine Equation (2021)    Anion gap 9 5 - 15    Comment: Performed  at [facility], [address], [place], [place] [address]  CBC per protocol     Status: None   Collection Time: 05/06/22 10:23 AM  Result Value Ref Range   WBC 8.4 4.0 - 10.5 K/uL   RBC 4.10 3.87 - 5.11 MIL/uL   Hemoglobin 12.5 12.0 - 15.0 g/dL   HCT 24.0 97.3 - 53.2 %   MCV 91.5 80.0 - 100.0 fL   MCH 30.5 26.0 - 34.0 pg   MCHC 33.3 30.0 - 36.0 g/dL   RDW 99.2 42.6 - 83.4 %   Platelets 189 150 - 400 K/uL   nRBC 0.0 0.0 - 0.2 %    Comment: Performed at [facility], [address] [place], [place] [address]  Glucose, capillary     Status: None   Collection Time: 05/06/22 12:07 PM  Result Value Ref Range   Glucose-Capillary 79 70 - 99 mg/dL    Comment: Glucose reference range applies only to samples taken after fasting for at least 8 hours.   No results found.  Review of Systems  All other systems reviewed and are negative.   Blood pressure 106/76, pulse (!) 110, temperature 98.7 F (37.1 C), temperature source Oral, resp. rate 18, height 5\' 7"   (1.702 m), weight 83.9 kg, last menstrual period 03/17/2022, SpO2 100 %. Physical Exam Constitutional:      Appearance: Normal appearance. She is normal weight.  HENT:     Head: Normocephalic and atraumatic.     Right Ear: External ear normal.     Left Ear: External ear normal.     Nose: Nose normal.     Mouth/Throat:     Mouth: Mucous membranes are moist.     Pharynx: Oropharynx is clear.  Eyes:     Extraocular Movements: Extraocular movements intact.     Conjunctiva/sclera: Conjunctivae normal.     Pupils: Pupils are equal, round, and reactive to light.  Cardiovascular:     Rate and Rhythm: Normal rate.  Pulmonary:     Effort: Pulmonary effort is normal.  Musculoskeletal:     Cervical back: Normal range of motion.  Skin:    General: Skin is warm and dry.  Neurological:     General: No focal deficit present.     Mental Status: She is alert and oriented to person, place, and time.  Psychiatric:        Mood and Affect: Mood normal.        Behavior: Behavior normal.        Thought Content: Thought content normal.        Judgment: Judgment normal.      Assessment/Plan Tonsil stones and chronic tonsillitis  To OR for tonsillectomy.  05/17/2022, MD 05/06/2022, 12:53 PM

## 2022-05-06 NOTE — Op Note (Signed)
Preop diagnosis: Tonsil stones and chronic tonsillitis Postop diagnosis: same Procedure: Tonsillectomy Surgeon: Jenne Pane Anesth: General Compl: None Findings: Tonsils 2+ Description:  After discussing risks, benefits, and alternatives, the patient was brought to the operative suite and placed on the operative table in the supine position.  Anesthesia was induced and the patient was intubated by the anesthesia team without difficulty.  The bed was turned 90 degrees from anesthesia and the eyes were taped closed.  The patient was given IV Decadron.  A head wrap was placed around the patient's head and the oropharynx was exposed with a Crow-Davis retractor that was placed in suspension on the Mayo stand.   The right tonsil was grasped with a curved Allis and retracted medially while a curvilinear incision was made with the Bovie electrocautery.  Dissection continued in the subcapsular plane until the tonsil was removed.  The same procedure was then performed on the left side.  Tonsils were not sent for pathology.  Bleeding was controlled using suction cautery.  After this was completed, the mouth and nose were copiously irrigated with saline.  A flexible suction catheter was passed down the esophagus to suction out the stomach and esophagus.  The Crow-Davis retractor was taken out of suspension and removed from the patient's mouth.  She was then turned back to anesthesia for wake-up and was extubated and moved to the recovery room in stable condition.

## 2022-05-06 NOTE — Addendum Note (Signed)
Addendum  created 05/06/22 1531 by Gwenyth Allegra, CRNA   Intraprocedure Meds edited

## 2022-05-06 NOTE — Anesthesia Procedure Notes (Signed)
Procedure Name: Intubation Date/Time: 05/06/2022 1:10 PM  Performed by: Eligha Bridegroom, CRNAPre-anesthesia Checklist: Patient identified, Emergency Drugs available, Suction available, Patient being monitored and Timeout performed Patient Re-evaluated:Patient Re-evaluated prior to induction Oxygen Delivery Method: Circle system utilized Preoxygenation: Pre-oxygenation with 100% oxygen Induction Type: IV induction Ventilation: Mask ventilation without difficulty and Oral airway inserted - appropriate to patient size Laryngoscope Size: Mac and 3 Grade View: Grade I Tube type: Oral Tube size: 7.0 mm Number of attempts: 1 Placement Confirmation: ETT inserted through vocal cords under direct vision, positive ETCO2 and breath sounds checked- equal and bilateral Secured at: 21 cm Tube secured with: Tape Dental Injury: Teeth and Oropharynx as per pre-operative assessment

## 2022-05-06 NOTE — Brief Op Note (Signed)
05/06/2022  1:37 PM  PATIENT:  Priscilla Perry  46 y.o. female  PRE-OPERATIVE DIAGNOSIS:  Chronic tonsillitis Tonsil stone  POST-OPERATIVE DIAGNOSIS:  Chronic tonsillitis Tonsil stone  PROCEDURE:  Procedure(s): TONSILLECTOMY (Bilateral)  SURGEON:  Surgeon(s) and Role:    Christia Reading, MD - Primary  PHYSICIAN ASSISTANT:   ASSISTANTS: none   ANESTHESIA:   general  EBL:  Minimal   BLOOD ADMINISTERED:none  DRAINS: none   LOCAL MEDICATIONS USED:  NONE  SPECIMEN:  No Specimen  DISPOSITION OF SPECIMEN:  N/A  COUNTS:  YES  TOURNIQUET:  * No tourniquets in log *  DICTATION: .Note written in EPIC  PLAN OF CARE: Discharge to home after PACU  PATIENT DISPOSITION:  PACU - hemodynamically stable.   Delay start of Pharmacological VTE agent (>24hrs) due to surgical blood loss or risk of bleeding: yes

## 2022-05-06 NOTE — Anesthesia Postprocedure Evaluation (Signed)
Anesthesia Post Note  Patient: Priscilla Perry  Procedure(s) Performed: TONSILLECTOMY (Bilateral)     Patient location during evaluation: PACU Anesthesia Type: General Level of consciousness: awake and alert Pain management: pain level controlled Vital Signs Assessment: post-procedure vital signs reviewed and stable Respiratory status: spontaneous breathing, nonlabored ventilation and respiratory function stable Cardiovascular status: blood pressure returned to baseline and stable Postop Assessment: no apparent nausea or vomiting Anesthetic complications: no   No notable events documented.  Last Vitals:  Vitals:   05/06/22 1401 05/06/22 1430  BP: 118/83 112/76  Pulse: (!) 104 84  Resp: 12 (!) 9  Temp:  36.9 C  SpO2: 95% 95%    Last Pain:  Vitals:   05/06/22 1440  TempSrc:   PainSc: 4                  Beryle Lathe

## 2022-05-06 NOTE — Transfer of Care (Signed)
Immediate Anesthesia Transfer of Care Note  Patient: Priscilla Perry  Procedure(s) Performed: TONSILLECTOMY (Bilateral)  Patient Location: PACU  Anesthesia Type:General  Level of Consciousness: awake, alert  and oriented  Airway & Oxygen Therapy: Patient Spontanous Breathing  Post-op Assessment: Report given to RN and Post -op Vital signs reviewed and stable  Post vital signs: Reviewed and stable  Last Vitals:  Vitals Value Taken Time  BP 125/78 05/06/22 1357  Temp    Pulse 104 05/06/22 1359  Resp 13 05/06/22 1359  SpO2 96 % 05/06/22 1359  Vitals shown include unvalidated device data.  Last Pain:  Vitals:   05/06/22 1004  TempSrc:   PainSc: 0-No pain         Complications: No notable events documented.

## 2022-05-07 ENCOUNTER — Encounter (HOSPITAL_COMMUNITY): Payer: Self-pay | Admitting: Otolaryngology

## 2022-05-21 IMAGING — CT CT CHEST HIGH RESOLUTION
2 of 8 series · 14 of 36 positions shown, 17 images · non-contrast
Comparison: No priors.

CLINICAL DATA: 45-year-old female with history of abnormal chest
x-ray. Evaluate for interstitial lung disease.



[Series 5: chest 2.00 br36 s3 cor soft · coronal · 0.63mm/px · 3 of 156 slices shown]
[im 32/156  lung]
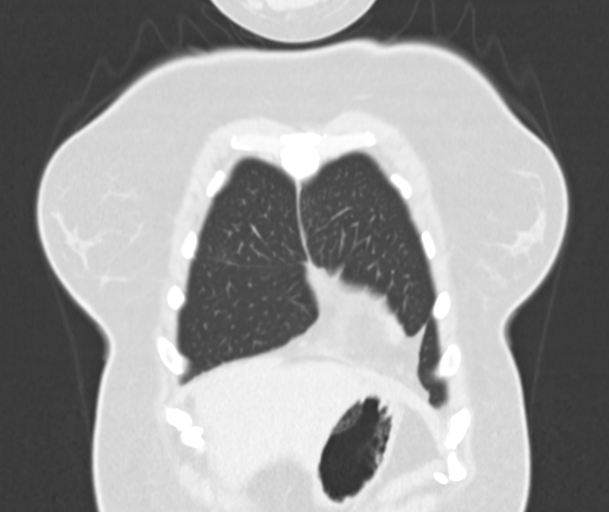
[im 63/156  lung]
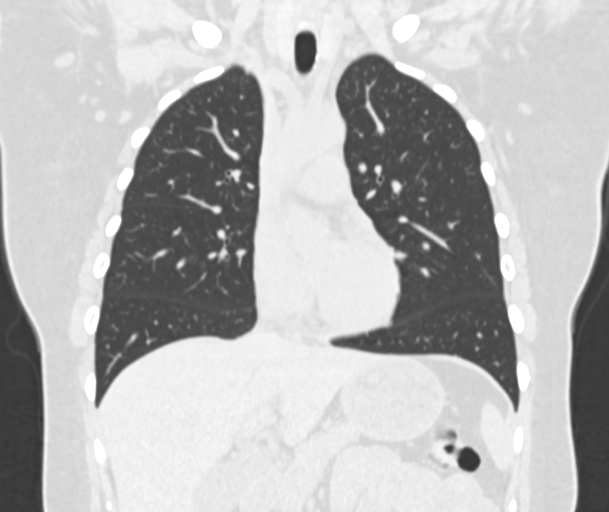
[im 94/156  lung]
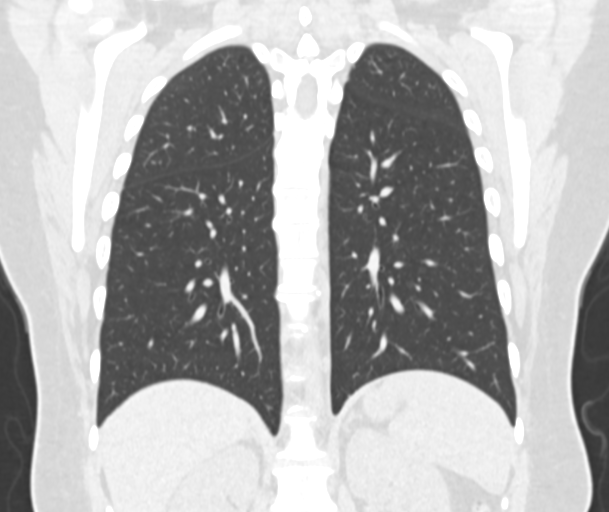

[Series 12: chest 1.00 br60 s3 high res thins 1x1 mm · axial · 0.75mm/px · z∈[+1430,+1700]mm · 11 of 324 slices shown, 14 images]
[im 27/324  mediastinal]
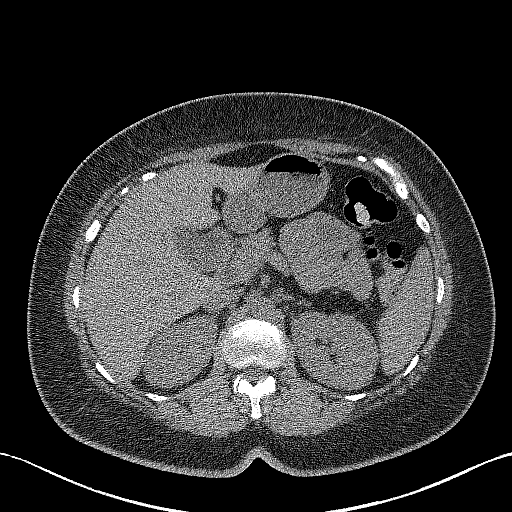
[im 27/324  lung]
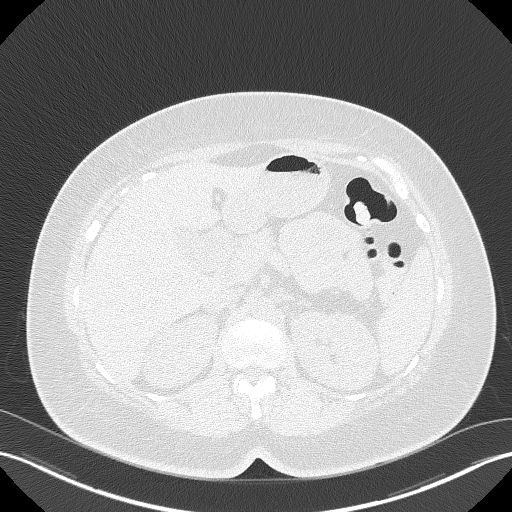
[im 54/324  lung]
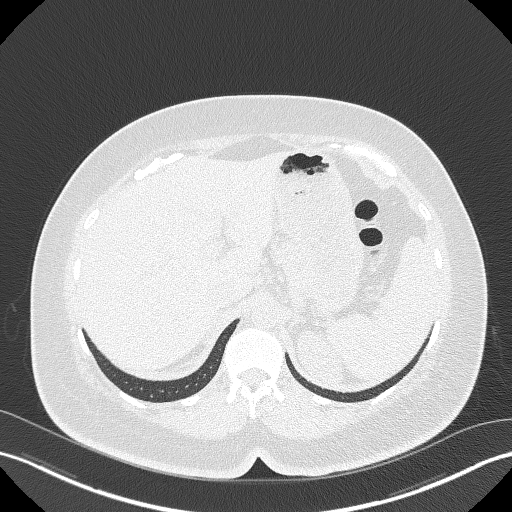
[im 81/324  lung]
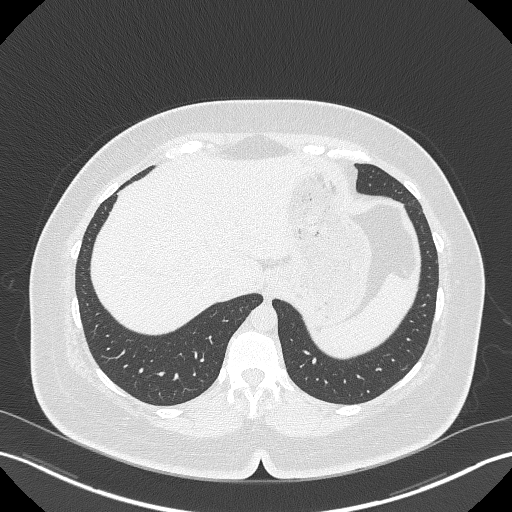
[im 108/324  lung]
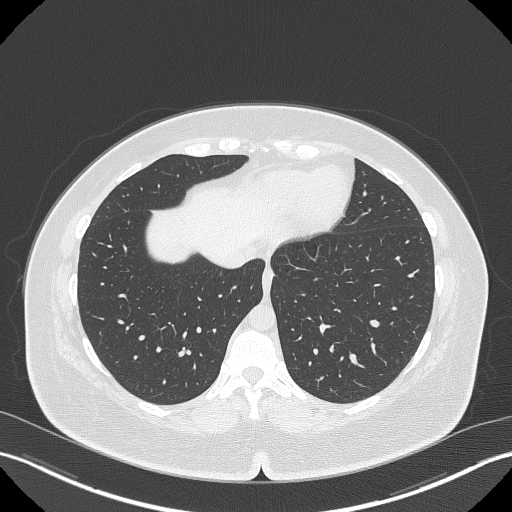
[im 135/324  mediastinal]
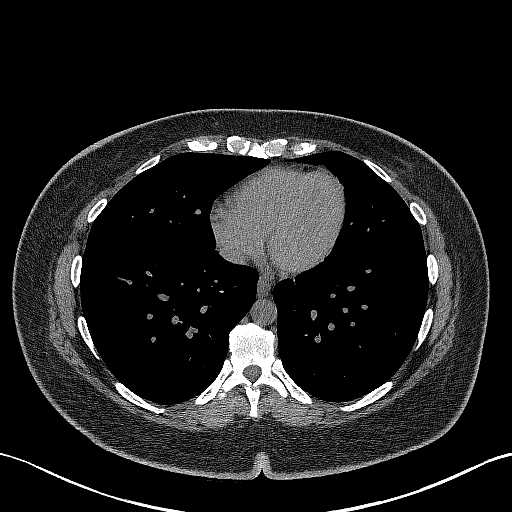
[im 135/324  lung]
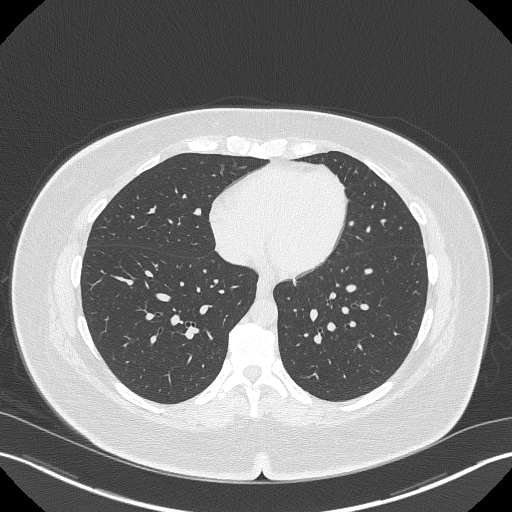
[im 162/324  lung]
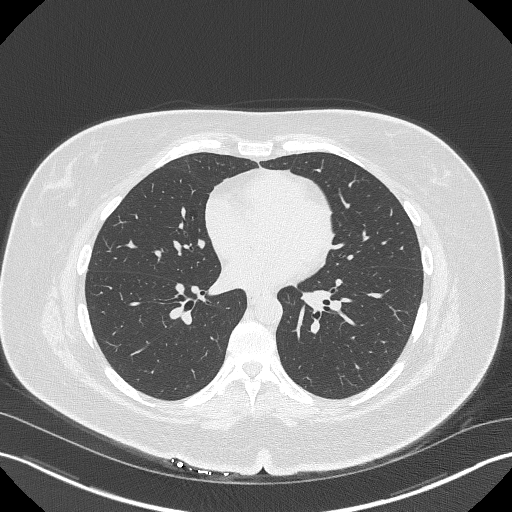
[im 189/324  lung]
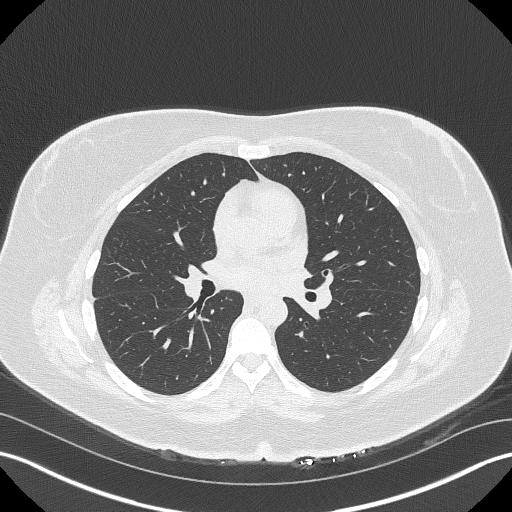
[im 216/324  lung]
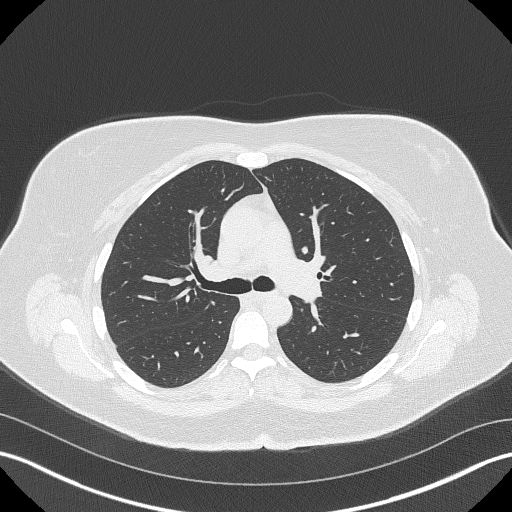
[im 243/324  mediastinal]
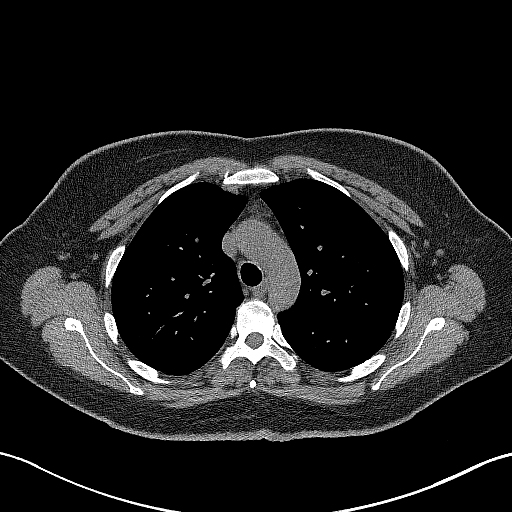
[im 243/324  lung]
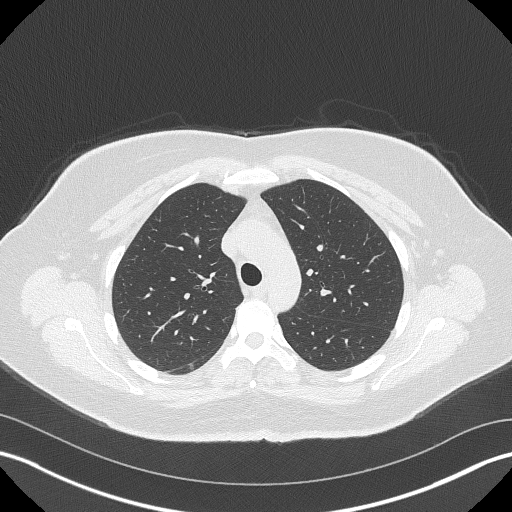
[im 270/324  lung]
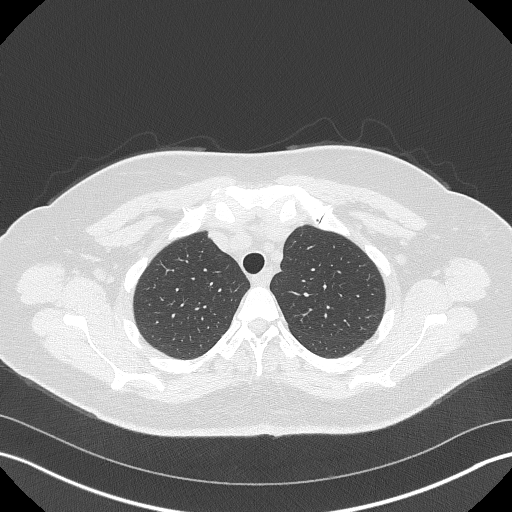
[im 297/324  lung]
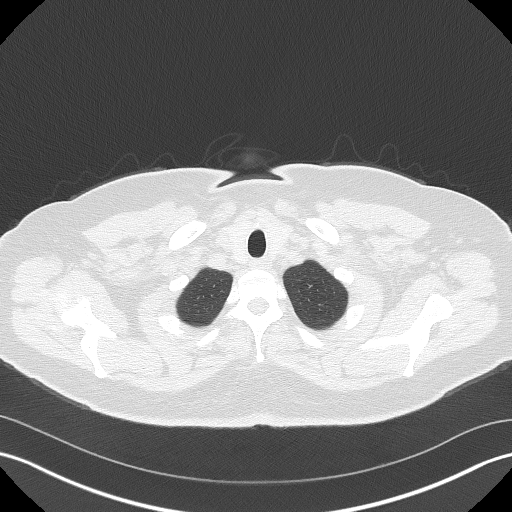

[14 of 36 positions shown; findings below may reference images not displayed]

FINDINGS: Cardiovascular: Heart size is normal. There is no significant
pericardial fluid, thickening or pericardial calcification. No
atherosclerotic calcifications are noted in the thoracic aorta or
the coronary arteries.

Mediastinum/Nodes: No pathologically enlarged mediastinal or hilar
lymph nodes. Please note that accurate exclusion of hilar adenopathy
is limited on noncontrast CT scans. Esophagus is unremarkable in
appearance. No axillary lymphadenopathy.

Lungs/Pleura: High-resolution images demonstrate no significant
regions of ground-glass attenuation, septal thickening, subpleural
reticulation, parenchymal banding, traction bronchiectasis or frank
honeycombing to indicate interstitial lung disease. Inspiratory and
expiratory imaging is unremarkable. No acute consolidative airspace
disease. No pleural effusions. No suspicious appearing pulmonary
nodules or masses are noted.

Upper Abdomen: Unremarkable.

Musculoskeletal: There are no aggressive appearing lytic or blastic
lesions noted in the visualized portions of the skeleton.
IMPRESSION: 1. No findings to suggest interstitial lung disease.
2. No acute findings in the thorax.

## 2022-07-22 DIAGNOSIS — J392 Other diseases of pharynx: Secondary | ICD-10-CM | POA: Insufficient documentation

## 2023-02-16 ENCOUNTER — Ambulatory Visit (INDEPENDENT_AMBULATORY_CARE_PROVIDER_SITE_OTHER): Payer: No Typology Code available for payment source | Admitting: Podiatry

## 2023-02-16 ENCOUNTER — Encounter: Payer: Self-pay | Admitting: Podiatry

## 2023-02-16 DIAGNOSIS — Q828 Other specified congenital malformations of skin: Secondary | ICD-10-CM

## 2023-02-16 DIAGNOSIS — E119 Type 2 diabetes mellitus without complications: Secondary | ICD-10-CM | POA: Diagnosis not present

## 2023-02-16 DIAGNOSIS — G43909 Migraine, unspecified, not intractable, without status migrainosus: Secondary | ICD-10-CM | POA: Insufficient documentation

## 2023-02-17 NOTE — Progress Notes (Addendum)
This patient presents to the office with chief complaint of a callus both feet.  She says this callus comes and goes.  She says she has become active and believes these callus have occurred due to her increased activity.  This has been occurring for years according to this patient.  Patient is diabetic.  Vascular  Dorsalis pedis and posterior tibial pulses are palpable  B/L.  Capillary return  WNL.  Temperature gradient is  WNL.  Skin turgor  WNL  Sensorium  Senn Weinstein monofilament wire  WNL. Normal tactile sensation.  Nail Exam  Patient has normal nails with no evidence of bacterial or fungal infection.  Orthopedic  Exam  Muscle tone and muscle strength  WNL.  No limitations of motion feet  B/L.  No crepitus or joint effusion noted.  Foot type is unremarkable and digits show no abnormalities.  HAV  B/L.   Skin  No open lesions.  Normal skin texture and turgor. 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  RTC prn   Helane Gunther DPM

## 2023-02-17 NOTE — Addendum Note (Signed)
Addended by: Helane Gunther on: 02/17/2023 08:19 AM   Modules accepted: Level of Service

## 2023-05-21 ENCOUNTER — Ambulatory Visit (INDEPENDENT_AMBULATORY_CARE_PROVIDER_SITE_OTHER): Payer: No Typology Code available for payment source | Admitting: Podiatry

## 2023-05-21 ENCOUNTER — Ambulatory Visit (INDEPENDENT_AMBULATORY_CARE_PROVIDER_SITE_OTHER): Payer: No Typology Code available for payment source

## 2023-05-21 DIAGNOSIS — R52 Pain, unspecified: Secondary | ICD-10-CM

## 2023-05-21 DIAGNOSIS — M722 Plantar fascial fibromatosis: Secondary | ICD-10-CM

## 2023-05-21 DIAGNOSIS — M7731 Calcaneal spur, right foot: Secondary | ICD-10-CM

## 2023-05-21 MED ORDER — TRIAMCINOLONE ACETONIDE 10 MG/ML IJ SUSP
10.0000 mg | Freq: Once | INTRAMUSCULAR | Status: AC
Start: 2023-05-21 — End: 2023-05-21
  Administered 2023-05-21: 10 mg

## 2023-05-21 NOTE — Progress Notes (Signed)
Subjective: Chief Complaint  Patient presents with   Foot Pain    Right heel pain. Sharp pains when applying pressure. Pain does not radiate. Patient has sx to left foot years ago for plantar fasciitis.    47 year old female presents the office for concerns, which are new. Started a little over a month ago. She has been working out. It is worsen in the morning when gshe gets up. No injuries. No numbnes sot tingling. She has tried some rolling thing and massage which helps temp.  A1c was 5.7 she reports.    Objective: AAO x3, NAD DP/PT pulses palpable bilaterally, CRT less than 3 seconds Tenderness to palpation along the plantar medial tubercle of the calcaneus at the insertion of plantar fascia on the right foot. There is no pain along the course of the plantar fascia within the arch of the foot. Plantar fascia appears to be intact. There is no pain with lateral compression of the calcaneus or pain with vibratory sensation. There is no pain along the course or insertion of the achilles tendon. No other areas of tenderness to bilateral lower extremities. No pain with calf compression, swelling, warmth, erythema  Assessment: Right heel pain, plantar fasciitis  Plan: -All treatment options discussed with the patient including all alternatives, risks, complications.  -X-rays were obtained reviewed.  3 views of the foot were obtained.  There is no evidence of acute fracture noted today.  Calcaneal spurs present. -We discussed both conservative as well as surgical options but since her symptoms are new or new start with conservative treatment right side. -Steroid injection performed.  See procedure note below -Anti-inflammatories as needed -Discussed stretching, icing on a regular basis -Discussed shoes and good arch support.  -Patient encouraged to call the office with any questions, concerns, change in symptoms.   Procedure: Injection Tendon/Ligament Discussed alternatives, risks,  complications and verbal consent was obtained.  Location: Right plantar fascia at the glabrous junction; medial approach. Skin Prep: Alcohol. Injectate: 0.5cc 0.5% marcaine plain, 0.5 cc 2% lidocaine plain and, 1 cc kenalog 10. Disposition: Patient tolerated procedure well. Injection site dressed with a band-aid.  Post-injection care was discussed and return precautions discussed.   Return in about 6 weeks (around 07/02/2023) for right plantar fasciitis .  Vivi Barrack DPM

## 2023-05-21 NOTE — Patient Instructions (Signed)

## 2023-07-01 ENCOUNTER — Ambulatory Visit: Payer: No Typology Code available for payment source | Admitting: Podiatry

## 2023-12-25 ENCOUNTER — Ambulatory Visit (INDEPENDENT_AMBULATORY_CARE_PROVIDER_SITE_OTHER): Payer: No Typology Code available for payment source | Admitting: Podiatry

## 2023-12-25 ENCOUNTER — Encounter: Payer: Self-pay | Admitting: Podiatry

## 2023-12-25 ENCOUNTER — Ambulatory Visit (INDEPENDENT_AMBULATORY_CARE_PROVIDER_SITE_OTHER): Payer: No Typology Code available for payment source

## 2023-12-25 DIAGNOSIS — M205X1 Other deformities of toe(s) (acquired), right foot: Secondary | ICD-10-CM | POA: Diagnosis not present

## 2023-12-25 DIAGNOSIS — M7989 Other specified soft tissue disorders: Secondary | ICD-10-CM | POA: Diagnosis not present

## 2023-12-25 DIAGNOSIS — M778 Other enthesopathies, not elsewhere classified: Secondary | ICD-10-CM | POA: Diagnosis not present

## 2023-12-25 NOTE — Progress Notes (Signed)
 Subjective: Chief Complaint  Patient presents with   Callouses    RM#11 Right foot callus on bunion area has had for several months no pain.   48 year old female presents the office today with concerns of a bump on the side of her right big toe which has been getting larger over time but she is also noticed her toes starting to shift towards the second toe as well.  This is a new issue. She states it does not hurt currently.  No recent treatment or injuries that she reports. Denies any systemic complaints such as fevers, chills, nausea, vomiting. No acute changes since last appointment, and no other complaints at this time.   Objective: AAO x3, NAD DP/PT pulses palpable bilaterally, CRT less than 3 seconds Scar on the medial aspect the right first MPJ from prior surgery.  On the medial first MTPJ there is a fluid-filled small mobile soft tissue mass approximately 0.5 cm in diameter.  There is no pain associated state is no crepitation.  There is no pain with MPJ range of motion.  No crepitus noted.  Decreased range of motion in dorsiflexion of the first MTPJ. 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  -X-rays were obtained reviewed.  Multiple views of the foot were obtained.  Soft tissue mass -Steroid injection performed to the area of soft tissue mass.  1/4 cc of Kenalog 10, quarter cc lidocaine plain was infiltrated subdermally into the area of soft tissue mass without complications.  Tolerated well.  Postinjection care discussed. -Dispensed gel offloading pad.  Hallux limitus -Discussed 1st metatarsal osteotomy doing decompress the joint as well as help fix the hallux abductus.  We discussed the procedures with the postoperative course.  However wanting to conservative treatment but should her symptoms persist or worsen  will reevaluate. -Patient encouraged to call the office with any questions, concerns, change in symptoms.   Vivi Barrack DPM

## 2024-01-18 ENCOUNTER — Ambulatory Visit: Payer: No Typology Code available for payment source | Admitting: Podiatry

## 2024-09-28 ENCOUNTER — Ambulatory Visit: Admitting: Podiatry

## 2024-09-28 ENCOUNTER — Ambulatory Visit (INDEPENDENT_AMBULATORY_CARE_PROVIDER_SITE_OTHER)

## 2024-09-28 ENCOUNTER — Encounter: Payer: Self-pay | Admitting: Podiatry

## 2024-09-28 VITALS — Ht 67.0 in | Wt 185.0 lb

## 2024-09-28 DIAGNOSIS — M722 Plantar fascial fibromatosis: Secondary | ICD-10-CM

## 2024-09-28 DIAGNOSIS — M7731 Calcaneal spur, right foot: Secondary | ICD-10-CM | POA: Diagnosis not present

## 2024-09-28 MED ORDER — METHYLPREDNISOLONE 4 MG PO TBPK: ORAL_TABLET | ORAL | 0 refills | Status: AC

## 2024-09-28 MED ORDER — TRIAMCINOLONE ACETONIDE 10 MG/ML IJ SUSP
10.0000 mg | Freq: Once | INTRAMUSCULAR | Status: AC
  Administered 2024-09-28: 10 mg via INTRAMUSCULAR

## 2024-09-28 NOTE — Progress Notes (Signed)
 Subjective: No chief complaint on file.   48 year old female presents the office today with concerns of ongoing right heel pain.  I saw her for the same issue last year but she also saw orthopedics more recently and she had a steroid injection performed which helped minimally.  She states the pain continues and she is having a limp because of this.  She had surgery in the left foot which sounds like a Topaz procedure.  She is inquiring on possible surgical intervention.  Objective: AAO x3, NAD DP/PT pulses palpable bilaterally, CRT less than 3 seconds There is tenderness palpation of the plantar medial tubercle of the calcaneus on insertion of plantar fascia.  There is no pain with lateral compression of the calcaneus.  No pain on the Achilles tendon.  No area of pinpoint tenderness. No pain with calf compression, swelling, warmth, erythema  Assessment: Right chronic heel pain, plantar fasciitis  Plan: -All treatment options discussed with the patient including all alternatives, risks, complications.  - We discussed both conservative as well as surgical treatment options.  Ordered MRI to further evaluate before proceeding with surgery. - Offered steroid injection but she wishes to hold off on this. - Medrol  dose pack  - Continue stretching, icing on a regular basis. - Continue supportive shoe gear  Return for MRI results .  Priscilla Perry DPM

## 2024-09-28 NOTE — Patient Instructions (Signed)

## 2024-10-09 ENCOUNTER — Ambulatory Visit: Admitting: Podiatry

## 2024-10-16 ENCOUNTER — Ambulatory Visit
Admission: RE | Admit: 2024-10-16 | Discharge: 2024-10-16 | Disposition: A | Source: Ambulatory Visit | Attending: Podiatry

## 2024-10-16 DIAGNOSIS — M722 Plantar fascial fibromatosis: Secondary | ICD-10-CM

## 2024-10-17 ENCOUNTER — Ambulatory Visit: Payer: Self-pay | Admitting: Podiatry

## 2024-10-19 ENCOUNTER — Other Ambulatory Visit

## 2024-11-06 ENCOUNTER — Encounter: Payer: Self-pay | Admitting: Podiatry

## 2024-11-06 ENCOUNTER — Ambulatory Visit: Admitting: Podiatry

## 2024-11-06 DIAGNOSIS — M722 Plantar fascial fibromatosis: Secondary | ICD-10-CM

## 2024-11-06 NOTE — Patient Instructions (Signed)

## 2024-11-06 NOTE — Progress Notes (Unsigned)
 Subjective: Chief Complaint  Patient presents with   Foot Pain    Rm14 Surgical consult right foot.    48 year old female presents the office today with concerns of ongoing right heel pain.  She presents today to discuss MRI results but she also would like to go ahead and proceed with surgical intervention on the right foot.  She has a history of a Topaz procedure on the left side and she did well from this patient was to proceed with a similar procedure.  At this time she is tender injections, shoe modifications, inserts, home therapy that any significant improvement and she wants to proceed with surgical intervention.   Objective: AAO x3, NAD DP/PT pulses palpable bilaterally, CRT less than 3 seconds There is tenderness palpation of the plantar medial tubercle of the calcaneus on insertion of plantar fascia.  There is no pain with lateral compression of the calcaneus.  No pain on the Achilles tendon.  No area of pinpoint tenderness.  No significant pain along course of the peroneal tendon. No pain with calf compression, swelling, warmth, erythema  Assessment: Right chronic heel pain, plantar fasciitis  Plan: -All treatment options discussed with the patient including all alternatives, risks, complications.  -I reviewed the MRI with the patient as well as her husband accompanies her today.  MRI does show severe plantar fasciitis.  At this time she is attempted numerous conservative treatments that any significant improvement she was to proceed with surgical intervention.  We discussed different options surgically.  Patient I discussed gastrocnemius recession, EPF, PRP.  However she had good results on the left side previously with a Topaz and she will to proceed with that.  Discussed possible need for further surgery. -The incision placement as well as the postoperative course was discussed with the patient. I discussed risks of the surgery which include, but not limited to, infection,  bleeding, pain, swelling, need for further surgery, delayed or nonhealing, painful or ugly scar, numbness or sensation changes, over/under correction, recurrence, further deformity, DVT/PE, loss of toe/foot. Patient understands these risks and wishes to proceed with surgery. The surgical consent was reviewed with the patient all 3 pages were signed. No promises or guarantees were given to the outcome of the procedure. All questions were answered to the best of my ability. Before the surgery the patient was encouraged to call the office if there is any further questions. The surgery will be performed at the [facility] on an outpatient basis.  No follow-ups on file.  Donnice JONELLE Fees DPM    - We discussed both conservative as well as surgical treatment options.  Ordered MRI to further evaluate before proceeding with surgery. - Offered steroid injection but she wishes to hold off on this. - Medrol  dose pack  - Continue stretching, icing on a regular basis. - Continue supportive shoe gear  Return for MRI results .  Donnice JONELLE Fees DPM

## 2024-11-13 ENCOUNTER — Telehealth: Payer: Self-pay | Admitting: Podiatry

## 2024-11-13 NOTE — Telephone Encounter (Signed)
 PATIENT IS SCHEDULED FOR SURGERY ON 1/14/206. patient IS ON M[place]JAR[place] AND HAS BEEN ADVISED HOW TO STOP PRIOR TO SURGERY. PATIENT NOT ON ANY BLOOD THINNER AND PHARMACY IS CORRECT IN CHART.

## 2024-11-16 ENCOUNTER — Telehealth: Payer: Self-pay | Admitting: Urology

## 2024-11-16 NOTE — Telephone Encounter (Signed)
 DOS - 11/22/24  28060 - FASCIOTOMY PLANTAR FASCIA PARTIAL RIGHT   [employer] EFFECTIVE DATE - 11/10/23   PER [employer] WEBSITE FOR CPT CODE 71939 Notification or Prior Authorization is not required for the requested services You are not required to submit a notification/prior authorization based on the information provided. The number above acknowledges your inquiry and our response. Please reference this number for future inquiries. Notification is not a guarantee of coverage or payment. Questions should be directed to [email or web address] > Eligibility or [phone].   Decision ID #: [id number]

## 2024-11-22 ENCOUNTER — Other Ambulatory Visit: Payer: Self-pay | Admitting: Podiatry

## 2024-11-22 DIAGNOSIS — M722 Plantar fascial fibromatosis: Secondary | ICD-10-CM | POA: Diagnosis not present

## 2024-11-22 MED ORDER — CEPHALEXIN 500 MG PO CAPS: 500.0000 mg | ORAL_CAPSULE | Freq: Three times a day (TID) | ORAL | 0 refills | Status: AC

## 2024-11-22 MED ORDER — PROMETHAZINE HCL 25 MG PO TABS: 25.0000 mg | ORAL_TABLET | Freq: Three times a day (TID) | ORAL | 0 refills | Status: AC | PRN

## 2024-11-22 MED ORDER — OXYCODONE-ACETAMINOPHEN 5-325 MG PO TABS: 1.0000 | ORAL_TABLET | Freq: Four times a day (QID) | ORAL | 0 refills | Status: DC | PRN

## 2024-11-27 ENCOUNTER — Ambulatory Visit: Admitting: Podiatry

## 2024-11-27 ENCOUNTER — Telehealth: Payer: Self-pay | Admitting: Podiatry

## 2024-11-27 ENCOUNTER — Encounter: Payer: Self-pay | Admitting: Podiatry

## 2024-11-27 ENCOUNTER — Other Ambulatory Visit: Payer: Self-pay | Admitting: Podiatry

## 2024-11-27 ENCOUNTER — Telehealth: Payer: Self-pay | Admitting: Lab

## 2024-11-27 DIAGNOSIS — M722 Plantar fascial fibromatosis: Secondary | ICD-10-CM

## 2024-11-27 MED ORDER — OXYCODONE-ACETAMINOPHEN 5-325 MG PO TABS: 1.0000 | ORAL_TABLET | Freq: Four times a day (QID) | ORAL | 0 refills | Status: AC | PRN

## 2024-11-27 NOTE — Progress Notes (Unsigned)
 t it

## 2024-11-27 NOTE — Telephone Encounter (Signed)
 Patient called stating that she was seen today and when she got home and her bandages were removed, her foot started throbbing and she is experiencing pain. She would like pain a prescription for pain medicine.

## 2024-11-27 NOTE — Telephone Encounter (Signed)
 Patient calling for refill on pain medication please advise.

## 2024-12-07 ENCOUNTER — Encounter

## 2024-12-11 ENCOUNTER — Encounter: Admitting: Podiatry

## 2024-12-11 ENCOUNTER — Encounter

## 2024-12-14 ENCOUNTER — Ambulatory Visit: Admitting: Podiatry

## 2024-12-14 NOTE — Progress Notes (Unsigned)
 Reffferla to benchamrk  Numb top of foot Irriation front of ankle

## 2024-12-14 NOTE — Patient Instructions (Signed)

## 2025-01-11 ENCOUNTER — Encounter: Admitting: Podiatry
